# Patient Record
Sex: Female | Born: 2000 | Race: Black or African American | Hispanic: No | Marital: Single | Smoking: Never smoker
Health system: Southern US, Community
[De-identification: ages and names within clinical notes are randomized; demographics above are authoritative.]

## PROBLEM LIST (undated history)

## (undated) DIAGNOSIS — Q909 Down syndrome, unspecified: Secondary | ICD-10-CM

## (undated) DIAGNOSIS — H539 Unspecified visual disturbance: Secondary | ICD-10-CM

## (undated) DIAGNOSIS — E669 Obesity, unspecified: Secondary | ICD-10-CM

## (undated) HISTORY — DX: Down syndrome, unspecified: Q90.9

## (undated) HISTORY — DX: Unspecified visual disturbance: H53.9

## (undated) HISTORY — DX: Obesity, unspecified: E66.9

---
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
## 2022-10-16 ENCOUNTER — Ambulatory Visit
Admission: EM | Admit: 2022-10-16 | Discharge: 2022-10-16 | Disposition: A | Discharge status: Home / Self Care | Payer: Medicaid Other | Attending: Internal Medicine | Admitting: Internal Medicine

## 2022-10-16 ENCOUNTER — Ambulatory Visit: Payer: Self-pay

## 2022-10-16 DIAGNOSIS — L02411 Cutaneous abscess of right axilla: Secondary | ICD-10-CM | POA: Diagnosis not present

## 2022-10-16 MED ORDER — SULFAMETHOXAZOLE-TRIMETHOPRIM 200-40 MG/5ML PO SUSP: 160.0000 mg | Freq: Two times a day (BID) | ORAL | 0 refills | Status: AC

## 2022-10-16 NOTE — Discharge Instructions (Signed)
It appears that she has an abscess under her armpit.  This is being treated with a liquid antibiotic.  Please continue warm compresses as we discussed.  Follow-up if symptoms persist or worsen.

## 2022-10-16 NOTE — ED Triage Notes (Signed)
[facility] to [facility] with legal guardian. Pt reports pain and discomfort in lump under right armpit. Pt reports she noted it 1 week ago and it popped over the weekend. Cleansed with peroxide but still painful.

## 2022-10-16 NOTE — ED Provider Notes (Signed)
[facility]    CSN: [id number] Arrival date & time: 10/16/22  1749      History   Chief Complaint Chief Complaint  Patient presents with   Skin Ulcer    HPI Shirley Caldwell is a 21 y.o. female.   Patient presents with caregiver given that she has a history of Down's syndrome.  Patient reports that she has been having a painful lump under her right axilla that started about a week ago.  She states that she squeezed it and it had purulent drainage.  They report history of similar abscess to axilla in the past.  Denies fever, body aches, chills.     Past Medical History:  Diagnosis Date   Down's syndrome    Obesity    Vision abnormalities     Patient Active Problem List   Diagnosis Date Noted   Abnormal laboratory test result 03/25/2015   Diabetes (HCC) 03/25/2015   Astigmatism 03/28/2014   Abscess of buttock, left 06/19/2013   Down syndrome 06/18/2013   Obesity, unspecified 06/18/2013   Vaccine refused by parent 06/18/2013    History reviewed. No pertinent surgical history.  OB History   No obstetric history on file.      Home Medications    Prior to Admission medications   Medication Sig Start Date End Date Taking? Authorizing Provider  sulfamethoxazole-trimethoprim (BACTRIM) 200-40 MG/5ML suspension Take 20 mLs (160 mg of trimethoprim total) by mouth 2 (two) times daily for 7 days. 10/16/22 10/23/22 Yes Reya Aurich, Acie Fredrickson, FNP  chlorhexidine (HIBICLENS) 4 % external liquid Apply topically daily as needed. Dilute 10-15 mL in water, Use daily when bathing for 1-2 weeks 02/05/16   Domenick Gong, MD  Cholecalciferol (VITAMIN D3) 5000 UNITS CAPS Take 5,000 Int'l Units/day by mouth daily. Take daily until follow up appointment. Patient not taking: Reported on 04/18/2015 03/26/15   Tilman Neat, MD  Multiple Vitamins-Minerals (MULTIVITAMIN & MINERAL PO) Take by mouth.    [provider]    Family History History reviewed. No pertinent  family history.  Social History Social History   Tobacco Use   Smoking status: Never  Substance Use Topics   Alcohol use: Not Currently   Drug use: Not Currently     Allergies   Patient has no known allergies.   Review of Systems Review of Systems Per HPI  Physical Exam Triage Vital Signs ED Triage Vitals [10/16/22 1810]  Enc Vitals Group     BP 117/74     Pulse Rate 82     Resp 20     Temp 98.1 F (36.7 C)     Temp Source Oral     SpO2 98 %     Weight      Height      Head Circumference      Peak Flow      Pain Score      Pain Loc      Pain Edu?      Excl. in GC?    No data found.  Updated Vital Signs BP 117/74 (BP Location: Left Arm)   Pulse 82   Temp 98.1 F (36.7 C) (Oral)   Resp 20   SpO2 98%   Visual Acuity Right Eye Distance:   Left Eye Distance:   Bilateral Distance:    Right Eye Near:   Left Eye Near:    Bilateral Near:     Physical Exam Constitutional:      General: She is not  in acute distress.    Appearance: Normal appearance. She is not toxic-appearing or diaphoretic.  HENT:     Head: Normocephalic and atraumatic.  Eyes:     Extraocular Movements: Extraocular movements intact.     Conjunctiva/sclera: Conjunctivae normal.  Pulmonary:     Effort: Pulmonary effort is normal.  Skin:    Comments: Approximately 0.5 cm abscess present to right axilla with mild purulent drainage noted.  Neurological:     General: No focal deficit present.     Mental Status: She is alert and oriented to person, place, and time. Mental status is at baseline.  Psychiatric:        Mood and Affect: Mood normal.        Behavior: Behavior normal.        Thought Content: Thought content normal.        Judgment: Judgment normal.      UC Treatments / Results  Labs (all labs ordered are listed, but only abnormal results are displayed) Labs Reviewed - No data to display  EKG   Radiology No results found.  Procedures Procedures (including  critical care time)  Medications Ordered in UC Medications - No data to display  Initial Impression / Assessment and Plan / UC Course  I have reviewed the triage vital signs and the nursing notes.  Pertinent labs & imaging results that were available during my care of the patient were reviewed by me and considered in my medical decision making (see chart for details).     Patient has abscess to right axilla which is draining on its own.  It is not conducive to I&D at this time.  Will treat with Bactrim.  Patient requesting liquid medications given that she has had difficulty swallowing pills in the past.  Advised warm compresses and wound care.  Advised patient and caregiver to follow-up if symptoms persist or worsen.  Caregiver verbalized understanding and was agreeable with plan. Final Clinical Impressions(s) / UC Diagnoses   Final diagnoses:  Abscess of right axilla     Discharge Instructions      It appears that she has an abscess under her armpit.  This is being treated with a liquid antibiotic.  Please continue warm compresses as we discussed.  Follow-up if symptoms persist or worsen.    ED Prescriptions     Medication Sig Dispense Auth. Provider   sulfamethoxazole-trimethoprim (BACTRIM) 200-40 MG/5ML suspension Take 20 mLs (160 mg of trimethoprim total) by mouth 2 (two) times daily for 7 days. 280 mL Teodora Medici, [place]      PDMP not reviewed this encounter.   Teodora Medici, [place] 10/16/22 1910

## 2024-05-25 ENCOUNTER — Emergency Department (HOSPITAL_BASED_OUTPATIENT_CLINIC_OR_DEPARTMENT_OTHER)
Admission: EM | Admit: 2024-05-25 | Discharge: 2024-05-25 | Disposition: A | Discharge status: Home / Self Care | Payer: MEDICAID | Attending: Emergency Medicine | Admitting: Emergency Medicine

## 2024-05-25 ENCOUNTER — Other Ambulatory Visit: Payer: Self-pay

## 2024-05-25 ENCOUNTER — Encounter (HOSPITAL_BASED_OUTPATIENT_CLINIC_OR_DEPARTMENT_OTHER): Payer: Self-pay | Admitting: Emergency Medicine

## 2024-05-25 DIAGNOSIS — E876 Hypokalemia: Secondary | ICD-10-CM | POA: Insufficient documentation

## 2024-05-25 DIAGNOSIS — R42 Dizziness and giddiness: Secondary | ICD-10-CM | POA: Diagnosis present

## 2024-05-25 DIAGNOSIS — R55 Syncope and collapse: Secondary | ICD-10-CM | POA: Insufficient documentation

## 2024-05-25 DIAGNOSIS — E119 Type 2 diabetes mellitus without complications: Secondary | ICD-10-CM | POA: Insufficient documentation

## 2024-05-25 DIAGNOSIS — D649 Anemia, unspecified: Secondary | ICD-10-CM

## 2024-05-25 LAB — URINALYSIS, ROUTINE W REFLEX MICROSCOPIC
Bacteria, UA: NONE SEEN
Bilirubin Urine: NEGATIVE
Glucose, UA: NEGATIVE mg/dL
Ketones, ur: NEGATIVE mg/dL
Leukocytes,Ua: NEGATIVE
Nitrite: NEGATIVE
RBC / HPF: >50 RBC/hpf (ref 0–5)
Specific Gravity, Urine: 1.022 (ref 1.005–1.030)
pH: 5.5 (ref 5.0–8.0)

## 2024-05-25 LAB — CBC WITH DIFFERENTIAL/PLATELET
Abs Immature Granulocytes: 0 10*3/uL (ref 0.00–0.07)
Basophils Absolute: 0 10*3/uL (ref 0.0–0.1)
Basophils Relative: 1 %
Eosinophils Absolute: 0 10*3/uL (ref 0.0–0.5)
Eosinophils Relative: 1 %
HCT: 33.7 % — ABNORMAL LOW (ref 36.0–46.0)
Hemoglobin: 11.1 g/dL — ABNORMAL LOW (ref 12.0–15.0)
Immature Granulocytes: 0 %
Lymphocytes Relative: 19 %
Lymphs Abs: 1 10*3/uL (ref 0.7–4.0)
MCH: 31.9 pg (ref 26.0–34.0)
MCHC: 32.9 g/dL (ref 30.0–36.0)
MCV: 96.8 fL (ref 80.0–100.0)
Monocytes Absolute: 0.3 10*3/uL (ref 0.1–1.0)
Monocytes Relative: 5 %
Neutro Abs: 4.1 10*3/uL (ref 1.7–7.7)
Neutrophils Relative %: 74 %
Platelets: 283 10*3/uL (ref 150–400)
RBC: 3.48 MIL/uL — ABNORMAL LOW (ref 3.87–5.11)
RDW: 16.9 % — ABNORMAL HIGH (ref 11.5–15.5)
WBC: 5.4 10*3/uL (ref 4.0–10.5)
nRBC: 0 % (ref 0.0–0.2)

## 2024-05-25 LAB — BASIC METABOLIC PANEL WITH GFR
Anion gap: 10 (ref 5–15)
BUN: 6 mg/dL (ref 6–20)
CO2: 16 mmol/L — ABNORMAL LOW (ref 22–32)
Calcium: 5.8 mg/dL — CL (ref 8.9–10.3)
Chloride: 119 mmol/L — ABNORMAL HIGH (ref 98–111)
Creatinine, Ser: 0.4 mg/dL — ABNORMAL LOW (ref 0.44–1.00)
GFR, Estimated: >60 mL/min (ref >60)
Glucose, Bld: 97 mg/dL (ref 70–99)
Potassium: 2.3 mmol/L — CL (ref 3.5–5.1)
Sodium: 144 mmol/L (ref 135–145)

## 2024-05-25 LAB — PREGNANCY, URINE: Preg Test, Ur: NEGATIVE

## 2024-05-25 MED ORDER — SODIUM CHLORIDE 0.9 % IV BOLUS
500.0000 mL | Freq: Once | INTRAVENOUS | Status: AC
  Administered 2024-05-25: 500 mL via INTRAVENOUS

## 2024-05-25 MED ORDER — POTASSIUM CHLORIDE 10 MEQ/100ML IV SOLN
10.0000 meq | INTRAVENOUS | Status: DC
  Administered 2024-05-25: 10 meq via INTRAVENOUS
  Filled 2024-05-25: qty 100

## 2024-05-25 MED ORDER — SODIUM CHLORIDE 0.9 % IV SOLN: INTRAVENOUS | Status: DC | PRN

## 2024-05-25 MED ORDER — POTASSIUM CHLORIDE CRYS ER 20 MEQ PO TBCR
40.0000 meq | EXTENDED_RELEASE_TABLET | Freq: Once | ORAL | Status: AC
  Administered 2024-05-25: 40 meq via ORAL
  Filled 2024-05-25: qty 2

## 2024-05-25 NOTE — ED Notes (Signed)
 Reviewed AVS/discharge instruction with patient. Time allotted for and all questions answered. Patient is agreeable for d/c and escorted to ed exit by staff.

## 2024-05-25 NOTE — ED Notes (Signed)
 Patient eating crackers and drinking water. Tolerating well

## 2024-05-25 NOTE — ED Triage Notes (Signed)
 Pt states she felts dizzy and then suddenly hit the floor. Pt guardian states this occurred while she was brushing her hair. Reports she was only out for a few seconds. Menstrual cycle started yesterday. Endorses cramping. Denies head, neck, back pain.

## 2024-05-25 NOTE — ED Notes (Signed)
 Patient unable to provide urine sample at this time.  Changing into gown/ family at bedside.

## 2024-05-25 NOTE — ED Provider Notes (Signed)
 [place] [facility] AT [facility] Provider Note  CSN: [id number] Arrival date & time: 05/25/24 0844  Chief Complaint(s) Loss of Consciousness  HPI Shirley Caldwell is a 23 y.o. female with PMHx Down syndrome presents with syncope without loss of consciousness.  Presents with aunt who is her guardian, they report she was getting ready this morning and aunt was brushing her hair when she all of a sudden started to go to the ground.  Reports she felt dizzy prior to the event.  Aunt assisted her to the ground and reports she did not lose consciousness.  Reports she may have hit something but if so it was minor as she did not note any trauma and patient was not complaining of pain anywhere.  Reports she was mildly disoriented for about a minute but has been interacting normally since that time.  Patient mild abdominal cramping but otherwise no other symptoms or pain.  States she started her menstrual cycle last night and usually has heavy bleeding, particularly the first day.  Has felt dizzy in the past around her cycle.  Past Medical History Past Medical History:  Diagnosis Date   Down's syndrome    Obesity    Vision abnormalities    Patient Active Problem List   Diagnosis Date Noted   Abnormal laboratory test result 03/25/2015   Diabetes (HCC) 03/25/2015   Astigmatism 03/28/2014   Abscess of buttock, left 06/19/2013   Down syndrome 06/18/2013   Obesity, unspecified 06/18/2013   Vaccine refused by parent 06/18/2013   Home Medication(s) Prior to Admission medications   Medication Sig Start Date End Date Taking? Authorizing Provider  chlorhexidine  (HIBICLENS ) 4 % external liquid Apply topically daily as needed. Dilute 10-15 mL in water, Use daily when bathing for 1-2 weeks 02/05/16   Ethlyn Herd, MD  Cholecalciferol (VITAMIN D3) 5000 UNITS CAPS Take 5,000 Int'l Units/day by mouth daily. Take daily until follow up appointment. Patient not taking: Reported on  04/18/2015 03/26/15   Aniceto Kern, MD  Multiple Vitamins-Minerals (MULTIVITAMIN & MINERAL PO) Take by mouth.    [provider]                                                                                                                                    Past Surgical History History reviewed. No pertinent surgical history. Family History History reviewed. No pertinent family history.  Social History Social History   Tobacco Use   Smoking status: Never  Substance Use Topics   Alcohol use: Not Currently   Drug use: Not Currently   Allergies Patient has no known allergies.  Review of Systems A thorough review of systems was obtained and all systems are negative except as noted in the HPI and PMH.   Physical Exam Vital Signs  I have reviewed the triage vital signs BP 112/74   Pulse 89   Temp 97.9 F (36.6 C) (Oral)  Resp 18   LMP 05/24/2024   SpO2 100%  Physical Exam General: Well-appearing, alert, NAD Eyes: PERRLA, anicteric sclera ENTM: Moist mucus membranes.  Poor dentition.  Left TM occluded by cerumen right TM clear.  No palpable cervical lymphadenopathy. Neck: Supple, non-tender Cardiovascular: RRR without murmur Respiratory: CTAB. Normal WOB on RA Gastrointestinal: Soft, nondistended.  Mild tenderness diffusely without focality.  No rebound tenderness or guarding.   MSK: No peripheral edema Derm: Warm, dry, no rashes noted Neuro: CN intact. Motor and sensation intact globally. Psych: Cooperative, pleasant   ED Results and Treatments Labs (all labs ordered are listed, but only abnormal results are displayed) Labs Reviewed  CBC WITH DIFFERENTIAL/PLATELET - Abnormal; Notable for the following components:      Result Value   RBC 3.48 (*)    Hemoglobin 11.1 (*)    HCT 33.7 (*)    RDW 16.9 (*)    All other components within normal limits  URINALYSIS, ROUTINE W REFLEX MICROSCOPIC - Abnormal; Notable for the following components:   Hgb urine  dipstick LARGE (*)    Protein, ur TRACE (*)    All other components within normal limits  BASIC METABOLIC PANEL WITH GFR - Abnormal; Notable for the following components:   Potassium 2.3 (*)    Chloride 119 (*)    CO2 16 (*)    Creatinine, Ser 0.40 (*)    Calcium 5.8 (*)    All other components within normal limits  PREGNANCY, URINE                                                                                                                          Radiology No results found.  Pertinent labs & imaging results that were available during my care of the patient were reviewed by me and considered in my medical decision making (see MDM for details).  Medications Ordered in ED Medications  0.9 %  sodium chloride infusion (0 mLs Intravenous Stopped 05/25/24 1242)  potassium chloride SA (KLOR-CON M) CR tablet 40 mEq (40 mEq Oral Given 05/25/24 1128)  sodium chloride 0.9 % bolus 500 mL (0 mLs Intravenous Stopped 05/25/24 1242)  potassium chloride SA (KLOR-CON M) CR tablet 40 mEq (40 mEq Oral Given 05/25/24 1249)                                                                   Medical Decision Making / ED Course    Medical Decision Making:    Shirley Caldwell is a 23 y.o. female PMHx Down syndrome presents with syncope without loss of consciousness. The complaint involves an extensive differential diagnosis and also carries with it a high risk of complications and morbidity.  Serious etiology was considered. Ddx includes but  is not limited to: Vasovagal, dehydration, cardiac arrhythmia, pregnancy  Complete initial physical exam performed, notably the patient was in no distress, well-appearing.    Reviewed and confirmed nursing documentation for past medical history, family history, social history.  Vital signs reviewed.     Brief summary:  23 year old female with history as above presenting with syncopal episode without loss of consciousness. In the setting of recent onset of  menstrual cycle, likely related to acute blood loss.  Otherwise well-appearing and reassuringly normal exam.  EKG unremarkable.  Will obtain basic lab work.   Clinical Course as of 05/25/24 1308  Thu May 25, 2024  1151 K 2.3, will replete with KCl IV x 4 runs and 40 meq PO.  Mild anemia with hemoglobin 11.1, recommend home oral iron supplementation every other day. [KH]  1308 IV infiltrated, will give additional p.o. potassium supplementation.  Discharge and recommend outpatient PCP follow-up in 1 week to repeat BMP. [KH]    Clinical Course User Index [KH] Jonne Netters, MD     Additional history obtained: -Additional history obtained from caregiver -External records from outside source obtained and reviewed including: Chart review including previous notes, labs, imaging, consultation notes including: OP PCP notes   Lab Tests: -I ordered, reviewed, and interpreted labs.   The pertinent results include:   Labs Reviewed  CBC WITH DIFFERENTIAL/PLATELET - Abnormal; Notable for the following components:      Result Value   RBC 3.48 (*)    Hemoglobin 11.1 (*)    HCT 33.7 (*)    RDW 16.9 (*)    All other components within normal limits  URINALYSIS, ROUTINE W REFLEX MICROSCOPIC - Abnormal; Notable for the following components:   Hgb urine dipstick LARGE (*)    Protein, ur TRACE (*)    All other components within normal limits  BASIC METABOLIC PANEL WITH GFR - Abnormal; Notable for the following components:   Potassium 2.3 (*)    Chloride 119 (*)    CO2 16 (*)    Creatinine, Ser 0.40 (*)    Calcium 5.8 (*)    All other components within normal limits  PREGNANCY, URINE    EKG   EKG Interpretation Date/Time:  Thursday May 25 2024 09:31:00 EDT Ventricular Rate:  73 PR Interval:  151 QRS Duration:  97 QT Interval:  371 QTC Calculation: 409 R Axis:   57  Text Interpretation: Sinus rhythm Since last tracing rate slower Confirmed by Sueellen Emery [phone]) on 05/25/2024 9:39:33  AM         Medicines ordered and prescription drug management: Meds ordered this encounter  Medications   DISCONTD: potassium chloride 10 mEq in 100 mL IVPB   potassium chloride SA (KLOR-CON M) CR tablet 40 mEq   0.9 %  sodium chloride infusion    Carrier Fluid Protocol   sodium chloride 0.9 % bolus 500 mL   potassium chloride SA (KLOR-CON M) CR tablet 40 mEq    -I have reviewed the patients home medicines and have made adjustments as needed  Cardiac Monitoring: The patient was maintained on a cardiac monitor.  I personally viewed and interpreted the cardiac monitored which showed an underlying rhythm of: NSR Continuous pulse oximetry interpreted by myself, 99% on RA.   Reevaluation: After the interventions noted above, I reevaluated the patient and found that they have improved  Co morbidities that complicate the patient evaluation  Past Medical History:  Diagnosis Date   Down's syndrome    Obesity  Vision abnormalities       Dispostion: Disposition decision including need for hospitalization was considered, and patient discharged from emergency department.    Final Clinical Impression(s) / ED Diagnoses Final diagnoses:  Near syncope  Hypokalemia        Jonne Netters, MD 05/25/24 1308    Sueellen Emery, MD 05/25/24 1311

## 2024-05-25 NOTE — Discharge Instructions (Signed)
 You are seen for a near syncope episode that was likely secondary to the acute blood loss from starting her menstrual cycle.  Please take oral iron supplementation at home every other day and keep in mind it can cause constipation.  You were also found to be hypokalemic with a potassium of 2.3.  You are given oral and IV potassium supplementation.  Please follow-up with your primary care doctor to repeat your potassium level in the next week.

## 2024-08-15 ENCOUNTER — Encounter (HOSPITAL_BASED_OUTPATIENT_CLINIC_OR_DEPARTMENT_OTHER): Payer: Self-pay

## 2024-08-15 ENCOUNTER — Emergency Department (HOSPITAL_BASED_OUTPATIENT_CLINIC_OR_DEPARTMENT_OTHER)
Admission: EM | Admit: 2024-08-15 | Discharge: 2024-08-15 | Disposition: A | Discharge status: Home / Self Care | Payer: MEDICAID | Attending: Emergency Medicine | Admitting: Emergency Medicine

## 2024-08-15 ENCOUNTER — Other Ambulatory Visit: Payer: Self-pay

## 2024-08-15 DIAGNOSIS — R55 Syncope and collapse: Secondary | ICD-10-CM | POA: Diagnosis present

## 2024-08-15 LAB — COMPREHENSIVE METABOLIC PANEL WITH GFR
ALT: 5 U/L (ref 0–44)
AST: 17 U/L (ref 15–41)
Albumin: 4.1 g/dL (ref 3.5–5.0)
Alkaline Phosphatase: 64 U/L (ref 38–126)
Anion gap: 14 (ref 5–15)
BUN: 10 mg/dL (ref 6–20)
CO2: 22 mmol/L (ref 22–32)
Calcium: 9.8 mg/dL (ref 8.9–10.3)
Chloride: 112 mmol/L — ABNORMAL HIGH (ref 98–111)
Creatinine, Ser: 0.73 mg/dL (ref 0.44–1.00)
GFR, Estimated: >60 mL/min (ref >60)
Glucose, Bld: 106 mg/dL — ABNORMAL HIGH (ref 70–99)
Potassium: 4.3 mmol/L (ref 3.5–5.1)
Sodium: 148 mmol/L — ABNORMAL HIGH (ref 135–145)
Total Bilirubin: 0.2 mg/dL (ref 0.0–1.2)
Total Protein: 7.4 g/dL (ref 6.5–8.1)

## 2024-08-15 LAB — URINALYSIS, ROUTINE W REFLEX MICROSCOPIC
Bilirubin Urine: NEGATIVE
Glucose, UA: NEGATIVE mg/dL
Hgb urine dipstick: NEGATIVE
Ketones, ur: NEGATIVE mg/dL
Leukocytes,Ua: NEGATIVE
Nitrite: NEGATIVE
Specific Gravity, Urine: 1.029 (ref 1.005–1.030)
pH: 5.5 (ref 5.0–8.0)

## 2024-08-15 LAB — CBC
HCT: 35.6 % — ABNORMAL LOW (ref 36.0–46.0)
Hemoglobin: 11.8 g/dL — ABNORMAL LOW (ref 12.0–15.0)
MCH: 33.4 pg (ref 26.0–34.0)
MCHC: 33.1 g/dL (ref 30.0–36.0)
MCV: 100.8 fL — ABNORMAL HIGH (ref 80.0–100.0)
Platelets: 290 K/uL (ref 150–400)
RBC: 3.53 MIL/uL — ABNORMAL LOW (ref 3.87–5.11)
RDW: 15.9 % — ABNORMAL HIGH (ref 11.5–15.5)
WBC: 6 K/uL (ref 4.0–10.5)
nRBC: 0 % (ref 0.0–0.2)

## 2024-08-15 LAB — CBG MONITORING, ED: Glucose-Capillary: 91 mg/dL (ref 70–99)

## 2024-08-15 LAB — PREGNANCY, URINE: Preg Test, Ur: NEGATIVE

## 2024-08-15 NOTE — Discharge Instructions (Addendum)
 Follow-up with primary care as needed establish care with OB/GYN for further management.  If symptoms continue or worsen return to ED for further evaluation.

## 2024-08-15 NOTE — ED Provider Notes (Signed)
 [place] EM[facility]ENC[facility] DEPARTMENT AT [facility] Provider Note   CSN: [id number] Arrival date & time: 08/15/24  1410     Patient presents with: Loss of Consciousness   Shirley Caldwell is a 23 y.o. female.  23 year old female presents to ED with guardian for complaints of syncope.  Patient has history of Down syndrome and guardian is main historian.  Patient has had 3 episodes of syncope spaced 1 months apart from each other.  They are in line during her menstrual cycle.  Guardian advises she has heavy bleeding the first few days of her cycle.  She advises these episodes are typically from sitting to standing or lying to standing and she does loses consciousness very briefly and she quickly gained consciousness back.  There is no associated shortness of breath or chest pain during these issues.  Patient denies any nausea, vomiting, diarrhea.  Patient has been eating and drinking normally.  Guardian advises she has been worked up for the same issue several times by ED and family medicine with no results.  She was placed on iron for a brief period and was advised to discontinue.  Patient currently has no complaints.     Prior to Admission medications   Medication Sig Start Date End Date Taking? Authorizing Provider  chlorhexidine  (HIBICLENS ) 4 % external liquid Apply topically daily as needed. Dilute 10-15 mL in water, Use daily when bathing for 1-2 weeks 02/05/16   Van Knee, MD  Cholecalciferol (VITAMIN D3) 5000 UNITS CAPS Take 5,000 Int'l Units/day by mouth daily. Take daily until follow up appointment. Patient not taking: Reported on 04/18/2015 03/26/15   Romualdo Will BROCKS, MD  Multiple Vitamins-Minerals (MULTIVITAMIN & MINERAL PO) Take by mouth.    [provider]    Allergies: Patient has no known allergies.    Review of Systems  Neurological:  Positive for syncope.  All other systems reviewed and are negative.   Updated Vital Signs BP 115/76   Pulse 79    Temp 98.1 F (36.7 C) (Oral)   Resp 18   Ht 4' 6 (1.372 m)   Wt 55.8 kg   LMP 08/10/2024   SpO2 100%   BMI 29.66 kg/m   Physical Exam Vitals and nursing note reviewed.  Constitutional:      Appearance: Normal appearance.  HENT:     Head: Normocephalic and atraumatic.     Nose: Nose normal.  Eyes:     Extraocular Movements: Extraocular movements intact.     Conjunctiva/sclera: Conjunctivae normal.     Pupils: Pupils are equal, round, and reactive to light.  Cardiovascular:     Rate and Rhythm: Normal rate.  Pulmonary:     Effort: Pulmonary effort is normal. No respiratory distress.  Musculoskeletal:        General: No swelling, tenderness, deformity or signs of injury. Normal range of motion.     Cervical back: Normal range of motion.  Skin:    General: Skin is warm and dry.  Neurological:     General: No focal deficit present.     Mental Status: She is alert.  Psychiatric:        Mood and Affect: Mood normal.        Behavior: Behavior normal.     (all labs ordered are listed, but only abnormal results are displayed) Labs Reviewed  COMPREHENSIVE METABOLIC PANEL WITH GFR - Abnormal; Notable for the following components:      Result Value   Sodium 148 (*)  Chloride 112 (*)    Glucose, Bld 106 (*)    All other components within normal limits  CBC - Abnormal; Notable for the following components:   RBC 3.53 (*)    Hemoglobin 11.8 (*)    HCT 35.6 (*)    MCV 100.8 (*)    RDW 15.9 (*)    All other components within normal limits  URINALYSIS, ROUTINE W REFLEX MICROSCOPIC - Abnormal; Notable for the following components:   Protein, ur TRACE (*)    All other components within normal limits  PREGNANCY, URINE  CBG MONITORING, ED    EKG: None  Radiology: No results found.  Procedures   Medications Ordered in the ED - No data to display  23 y.o. female presents to the ED with complaints of syncope, this involves an extensive number of treatment options, and  is a complaint that carries with it a high risk of complications and morbidity.  The differential diagnosis includes arrhythmias, vasovagal response, dehydration, anemia, electrolyte abnormality, (Ddx)  On arrival pt is nontoxic, vitals stable in ED and patient in no obvious distress.  Patient has no current complaints and reporting no pain.  Additional history obtained from chart review and patient was placed on iron and potassium.   I ordered CBC, CMP, UA, CBG which showed no acute abnormalities, patient's hemoglobin is elevated from last visit   ED Course:   Patient is sitting comfortably on ED bed in no obvious distress and complaining of no current symptoms.  Patient had a syncopal episode yesterday while they were out of town and mother caught her as she was falling to the ground.  Patient immediately gained consciousness.  Patient had no injury from the fall.  Guardian advises it was from her getting out of bed and standing in the morning and she is currently towards the end of her cycle.  Guardian is concerned that this continues to happen and she has been seen by primary care with no results.  She advises she is starting with a new primary care.  Patient has no recent illnesses, nausea, vomiting, diarrhea, urinary symptoms, abdominal pain, dizziness, weakness, headaches.  Patient has had no weakness or focal deficits since the injury.  Patient has not had any more episodes of syncope since yesterday and has been acting her normal and eating and drinking per normal.  On reevaluation patient was able to stand up from ED bed without assistance and walk around the room without any reports of dizziness, weakness, or headache.  Patient reports she feels perfectly fine.  I discussed further follow-up with guardian and advised for her to continue management with family medicine and potentially get evaluated by OB/GYN.  Patient agreed with treatment course and was comfortable with  discharge.    Portions of this note were generated with Scientist, clinical (histocompatibility and immunogenetics). Dictation errors may occur despite best attempts at proofreading.    Final diagnoses:  Syncope, unspecified syncope type    ED Discharge Orders     None          Myriam Fonda GORMAN DEVONNA 08/15/24 2142    Doretha Folks, MD 08/17/24 2056

## 2024-08-15 NOTE — ED Triage Notes (Signed)
 Arrives POV with complaints of having a syncopal episode today.  Patient is accompanied by her legal guardian, who states that this a recurrent issue and the patient is seeing here PCP for it.
